# Patient Record
Sex: Male | Born: 1975 | Race: Black or African American | Hispanic: No | Marital: Single | Smoking: Never smoker
Health system: Southern US, Community
[De-identification: ages and names within clinical notes are randomized; demographics above are authoritative.]

## PROBLEM LIST (undated history)

## (undated) ENCOUNTER — Ambulatory Visit (HOSPITAL_COMMUNITY): Admission: EM | Payer: Self-pay | Source: Home / Self Care

---

## 2010-08-28 ENCOUNTER — Ambulatory Visit: Payer: Self-pay | Admitting: Radiology

## 2010-08-28 ENCOUNTER — Emergency Department (HOSPITAL_BASED_OUTPATIENT_CLINIC_OR_DEPARTMENT_OTHER): Admission: EM | Admit: 2010-08-28 | Discharge: 2010-08-28 | Payer: Self-pay | Admitting: Emergency Medicine
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
## 2021-06-15 ENCOUNTER — Other Ambulatory Visit: Payer: Self-pay

## 2021-06-17 ENCOUNTER — Ambulatory Visit (HOSPITAL_COMMUNITY)
Admission: EM | Admit: 2021-06-17 | Discharge: 2021-06-17 | Disposition: A | Discharge status: Home / Self Care | Payer: Managed Care, Other (non HMO) | Attending: Emergency Medicine | Admitting: Emergency Medicine

## 2021-06-17 ENCOUNTER — Other Ambulatory Visit: Payer: Self-pay

## 2021-06-17 ENCOUNTER — Encounter (HOSPITAL_COMMUNITY): Payer: Self-pay | Admitting: *Deleted

## 2021-06-17 DIAGNOSIS — Z113 Encounter for screening for infections with a predominantly sexual mode of transmission: Secondary | ICD-10-CM | POA: Insufficient documentation

## 2021-06-17 NOTE — Discharge Instructions (Addendum)
Labs pending 2-3 days, you will be contacted if positive for any sti and treatment will be sent to the pharmacy, you will have to return to the clinic if positive for gonorrhea to receive treatment   Please refrain from having sex until labs results, if positive please refrain from having sex until treatment complete and symptoms resolve   If positive for HIV, Syphilis, Chlamydia  gonorrhea or trichomoniasis please notify partner or partners so they may tested as well  Moving forward, it is recommended you use some form of protection against the transmission of sti infections  such as condoms or dental dams with each sexual encounter   

## 2021-06-17 NOTE — ED Triage Notes (Signed)
Pt request to be checked for STD

## 2021-06-17 NOTE — ED Provider Notes (Signed)
[facility]    CSN: [id number] Arrival date & time: 06/17/21  1738      History   Chief Complaint Chief Complaint  Patient presents with   SEXUALLY TRANSMITTED DISEASE    HPI CANYON LOHR is a 45 y.o. male.   Patient presents requesting STI testing.  Denies all symptoms.  Sexually active, 2 partners, no condom use.  History reviewed. No pertinent past medical history.  There are no problems to display for this patient.   History reviewed. No pertinent surgical history.     Home Medications    Prior to Admission medications   Not on File    Family History History reviewed. No pertinent family history.  Social History Social History   Tobacco Use   Smoking status: Never   Smokeless tobacco: Never     Allergies   Patient has no known allergies.   Review of Systems Review of Systems  Constitutional: Negative.   Respiratory: Negative.    Cardiovascular: Negative.   Genitourinary: Negative.   Skin: Negative.     Physical Exam Triage Vital Signs ED Triage Vitals [06/17/21 1805]  Enc Vitals Group     BP 124/79     Pulse Rate 93     Resp 18     Temp 99.2 F (37.3 C)     Temp Source Oral     SpO2 100 %     Weight      Height      Head Circumference      Peak Flow      Pain Score 0     Pain Loc      Pain Edu?      Excl. in GC?    No data found.  Updated Vital Signs BP 124/79   Pulse 93   Temp 99.2 F (37.3 C) (Oral)   Resp 18   SpO2 100%   Visual Acuity Right Eye Distance:   Left Eye Distance:   Bilateral Distance:    Right Eye Near:   Left Eye Near:    Bilateral Near:     Physical Exam Constitutional:      Appearance: Normal appearance. He is normal weight.  Eyes:     Extraocular Movements: Extraocular movements intact.  Genitourinary:    Comments: Deferred, self collected urethral swab Skin:    General: Skin is warm and dry.  Neurological:     Mental Status: He is alert and oriented to person, place, and  time. Mental status is at baseline.  Psychiatric:        Mood and Affect: Mood normal.        Behavior: Behavior normal.     UC Treatments / Results  Labs (all labs ordered are listed, but only abnormal results are displayed) Labs Reviewed  CYTOLOGY, (ORAL, ANAL, URETHRAL) ANCILLARY ONLY    EKG   Radiology No results found.  Procedures Procedures (including critical care time)  Medications Ordered in UC Medications - No data to display  Initial Impression / Assessment and Plan / UC Course  I have reviewed the triage vital signs and the nursing notes.  Pertinent labs & imaging results that were available during my care of the patient were reviewed by me and considered in my medical decision making (see chart for details).  Routine screening for STI  1.  Urethral swab pending, declined blood work, will treat per protocol, advised abstinence until labs results and/or treatment is complete Final Clinical Impressions(s) / UC  Diagnoses   Final diagnoses:  Routine screening for STI (sexually transmitted infection)     Discharge Instructions      Labs pending 2-3 days, you will be contacted if positive for any sti and treatment will be sent to the pharmacy, you will have to return to the clinic if positive for gonorrhea to receive treatment   Please refrain from having sex until labs results, if positive please refrain from having sex until treatment complete and symptoms resolve   If positive for HIV, Syphilis, Chlamydia  gonorrhea or trichomoniasis please notify partner or partners so they may tested as well  Moving forward, it is recommended you use some form of protection against the transmission of sti infections  such as condoms or dental dams with each sexual encounter     ED Prescriptions   None    PDMP not reviewed this encounter.   Valinda Hoar, [place] 06/17/21 [phone]

## 2021-06-18 LAB — CYTOLOGY, (ORAL, ANAL, URETHRAL) ANCILLARY ONLY
Chlamydia: NEGATIVE
Comment: NEGATIVE
Comment: NEGATIVE
Comment: NORMAL
Neisseria Gonorrhea: NEGATIVE
Trichomonas: NEGATIVE

## 2024-08-27 ENCOUNTER — Other Ambulatory Visit: Payer: Self-pay

## 2024-08-27 ENCOUNTER — Emergency Department (HOSPITAL_BASED_OUTPATIENT_CLINIC_OR_DEPARTMENT_OTHER)
Admission: EM | Admit: 2024-08-27 | Discharge: 2024-08-27 | Disposition: A | Discharge status: Home / Self Care | Attending: Emergency Medicine | Admitting: Emergency Medicine

## 2024-08-27 ENCOUNTER — Encounter (HOSPITAL_BASED_OUTPATIENT_CLINIC_OR_DEPARTMENT_OTHER): Payer: Self-pay | Admitting: Emergency Medicine

## 2024-08-27 DIAGNOSIS — R196 Halitosis: Secondary | ICD-10-CM | POA: Diagnosis present

## 2024-08-27 NOTE — Discharge Instructions (Addendum)
 Return to the emergency department if your symptoms worsen.  I have provided you the contact information for [facility] at [facility], please contact their office to establish care since you do not have a primary care provider.  Continue to push fluids to avoid dehydration, as this may contribute to bad breath.  You may also try lozenges for relief of your bad breath, these will help you produce additional saliva to prevent dry mouth.

## 2024-08-27 NOTE — ED Triage Notes (Signed)
 Pt saying he is here because he went to dentist and was told to go to see a doctor for his bad breath  .  Patient denies abdominal pain or dental pain .  He said he don't go to doctors . He came in for check up .

## 2024-08-27 NOTE — ED Provider Notes (Signed)
 [place] [facility] [place] Provider Note   CSN: [id number] Arrival date & time: 08/27/24  9077     Patient presents with: No chief complaint on file.   Chase Holt is a 48 y.o. male.   48 year old male presenting with complaint of bad breath.  Patient reports that symptoms that been ongoing for 7 years, states that several hours after brushing his teeth if he eats he will develop bad breath like I never brushed my teeth.  He sees a dentist regularly, with the last visit being several weeks ago, his dentist advised him that he does not have any evidence of dental caries/infection and that bad breath is likely secondary to dry mouth.  He denies dental pain, pain/difficulty swallowing, increased thirst/urination, bad taste in mouth, chest pain, shortness of breath, abdominal pain.  He is not established with a primary care provider.  When asked what prompted him to come to the emergency department today after experiencing the symptoms for 7 years, he reports that he was having his oil changed nearby and decided to just come in for a checkup.        Prior to Admission medications   Not on File    Allergies: Patient has no known allergies.    Review of Systems  Updated Vital Signs BP 124/88   Pulse 62   Temp 98.2 F (36.8 C)   Resp 16   Wt 80.3 kg   SpO2 99%   Physical Exam Vitals and nursing note reviewed.  HENT:     Head: Normocephalic.     Mouth/Throat:     Mouth: Mucous membranes are moist.     Dentition: No gingival swelling, dental caries, dental abscesses or gum lesions.     Pharynx: Oropharynx is clear. Uvula midline. No pharyngeal swelling, oropharyngeal exudate, posterior oropharyngeal erythema or uvula swelling.     Tonsils: No tonsillar exudate or tonsillar abscesses.  Eyes:     Extraocular Movements: Extraocular movements intact.  Cardiovascular:     Rate and Rhythm: Normal rate and regular rhythm.  Pulmonary:     Effort:  Pulmonary effort is normal.     Breath sounds: Normal breath sounds.  Abdominal:     Palpations: Abdomen is soft.     Tenderness: There is no abdominal tenderness. There is no guarding.  Musculoskeletal:     Right lower leg: No edema.     Left lower leg: No edema.     Comments: Moves all extremities spontaneously without difficulty  Skin:    General: Skin is warm and dry.  Neurological:     Mental Status: He is alert and oriented to person, place, and time.     (all labs ordered are listed, but only abnormal results are displayed) Labs Reviewed - No data to display  EKG: None  Radiology: No results found.   Procedures   Medications Ordered in the ED - No data to display                                  Medical Decision Making This patient presents to the ED for concern of bad breath, this involves an extensive number of treatment options, and is a complaint that carries with it a high risk of complications and morbidity.  The differential diagnosis includes dental caries, dental abscess, dry mouth, diabetes, poor health literacy   Additional history obtained:  Additional history  obtained from record review External records from outside source obtained and reviewed including prior urgent care note   Cardiac Monitoring: / EKG:  The patient was maintained on a cardiac monitor.  I personally viewed and interpreted the cardiac monitored which showed an underlying rhythm of: NSR     Problem List / ED Course / Critical interventions / Medication management I have reviewed the patients home medicines and have made adjustments as needed   Test / Admission - Considered:  Physical exam is unremarkable as above, no evidence of dental caries/abscess formation, oropharynx is moist and within normal limits.  At this time I do not feel that any labs/imaging are warranted.  Patient follows regularly with a dentist, I encouraged him to keep these appointments.  However, he does  not have a primary care provider, I will provide him with the contact information to establish care with a primary care provider locally.  I discussed with the patient that given that he is in his mid 109s and is not established with a primary care provider, he is at risk for developing conditions like hypertension, diabetes, hyperlipidemia, and colon cancer if he does not undergo regular screenings as recommended.  He voiced understanding and is in agreement with this plan.  I recommend that he continue to hydrate adequately and use lozenges to prevent dry mouth/bad breath.  He is appropriate for discharge at this time, return precautions discussed.         Final diagnoses:  Bad breath    ED Discharge Orders     None          Glendia Rocky LOISE DEVONNA 08/27/24 1007    Randol Simmonds, MD 08/29/24 [phone]
